# Patient Record
Sex: Female | Born: 1960 | Race: White | Hispanic: No | Marital: Married | Smoking: Never smoker
Health system: Southern US, Community
[De-identification: ages and names within clinical notes are randomized; demographics above are authoritative.]

## PROBLEM LIST (undated history)

## (undated) DIAGNOSIS — Z79899 Other long term (current) drug therapy: Secondary | ICD-10-CM

## (undated) DIAGNOSIS — I10 Essential (primary) hypertension: Secondary | ICD-10-CM

## (undated) DIAGNOSIS — M51369 Other intervertebral disc degeneration, lumbar region without mention of lumbar back pain or lower extremity pain: Secondary | ICD-10-CM

## (undated) DIAGNOSIS — M5136 Other intervertebral disc degeneration, lumbar region: Secondary | ICD-10-CM

## (undated) DIAGNOSIS — E7800 Pure hypercholesterolemia, unspecified: Secondary | ICD-10-CM

## (undated) DIAGNOSIS — F319 Bipolar disorder, unspecified: Secondary | ICD-10-CM

## (undated) DIAGNOSIS — K219 Gastro-esophageal reflux disease without esophagitis: Secondary | ICD-10-CM

---

## 1998-12-14 ENCOUNTER — Encounter: Payer: Self-pay | Admitting: Specialist

## 1998-12-14 ENCOUNTER — Ambulatory Visit (HOSPITAL_COMMUNITY): Admission: RE | Admit: 1998-12-14 | Discharge: 1998-12-14 | Payer: Self-pay | Admitting: Specialist

## 1999-04-24 ENCOUNTER — Other Ambulatory Visit: Admission: RE | Admit: 1999-04-24 | Discharge: 1999-04-24 | Payer: Self-pay | Admitting: Obstetrics and Gynecology

## 2000-12-05 ENCOUNTER — Encounter: Payer: Self-pay | Admitting: Obstetrics and Gynecology

## 2000-12-05 ENCOUNTER — Ambulatory Visit (HOSPITAL_COMMUNITY): Admission: RE | Admit: 2000-12-05 | Discharge: 2000-12-05 | Payer: Self-pay | Admitting: Obstetrics and Gynecology

## 2000-12-06 ENCOUNTER — Ambulatory Visit (HOSPITAL_COMMUNITY): Admission: AD | Admit: 2000-12-06 | Discharge: 2000-12-06 | Payer: Self-pay | Admitting: Obstetrics and Gynecology

## 2000-12-06 ENCOUNTER — Encounter (INDEPENDENT_AMBULATORY_CARE_PROVIDER_SITE_OTHER): Payer: Self-pay | Admitting: *Deleted

## 2001-06-29 ENCOUNTER — Other Ambulatory Visit: Admission: RE | Admit: 2001-06-29 | Discharge: 2001-06-29 | Payer: Self-pay | Admitting: Obstetrics and Gynecology

## 2002-06-30 ENCOUNTER — Other Ambulatory Visit: Admission: RE | Admit: 2002-06-30 | Discharge: 2002-06-30 | Payer: Self-pay | Admitting: Obstetrics and Gynecology

## 2003-08-02 ENCOUNTER — Other Ambulatory Visit: Admission: RE | Admit: 2003-08-02 | Discharge: 2003-08-02 | Payer: Self-pay | Admitting: Obstetrics and Gynecology

## 2004-08-10 ENCOUNTER — Other Ambulatory Visit: Admission: RE | Admit: 2004-08-10 | Discharge: 2004-08-10 | Payer: Self-pay | Admitting: Obstetrics and Gynecology

## 2005-08-28 ENCOUNTER — Other Ambulatory Visit: Admission: RE | Admit: 2005-08-28 | Discharge: 2005-08-28 | Payer: Self-pay | Admitting: Obstetrics and Gynecology

## 2008-05-30 ENCOUNTER — Ambulatory Visit: Payer: Self-pay | Admitting: Radiology

## 2008-05-30 ENCOUNTER — Ambulatory Visit (HOSPITAL_BASED_OUTPATIENT_CLINIC_OR_DEPARTMENT_OTHER): Admission: RE | Admit: 2008-05-30 | Discharge: 2008-05-30 | Payer: Self-pay | Admitting: Family Medicine

## 2009-05-02 ENCOUNTER — Encounter: Admission: RE | Admit: 2009-05-02 | Discharge: 2009-05-02 | Payer: Self-pay | Admitting: Obstetrics and Gynecology

## 2010-10-19 NOTE — Op Note (Signed)
[facility]  Patient:    Bridget Blevins, Bridget Blevins                         MRN: [id number] Proc. Date: 12/06/00 Adm. Date:  [id number] Attending:  Miguel Aschoff                           Operative Report  PREOPERATIVE DIAGNOSIS: 1. Fetal demise 11 weeks. 2. Cervical polyp.  POSTOPERATIVE DIAGNOSIS: 1. Fetal demise 11 weeks. 2. Cervical polyp.  OPERATION:  Removal of cervical polyp.  Dilatation and suction evacuation of uterus.  SURGEON:  Miguel Aschoff, M.D.  ANESTHESIA:  IV sedation with paracervical block.  COMPLICATIONS:  None.  ESTIMATED BLOOD LOSS:  INDICATIONS:  The patient is a 50 year old white female gravida 3, para 2-0-0-2 at approximately [redacted] weeks gestation.  The patient reported some light spotting and on examination with ultrasound she was found to have a fetal demise present in the uterus.  This was confirmed with two ultrasounds.  In view of fetal demise, she is being taken to the operating room now to evacuate the uterus.  In addition, she is noted to have a cervical polyp and this is going to be removed at this procedure.  Informed consent has been obtained.  DESCRIPTION OF PROCEDURE:  The patient was taken to the operating room and placed in a supine position and IV sedation was administered without difficulty.  She was then placed in a dorsal lithotomy position and prepped and draped in the usual sterile fashion.  Speculum was then placed in the vaginal vault.  The anterior cervical lip was grasped with a tenaculum and then the cervix was injected with 18 cc of 1% Xylocaine by placing 6 cc of Xylocaine at the 12, 4 and 8 oclock positions on the cervix.  Once this was done, using polyp forceps, the cervical polyp was removed without difficulty and this was sent as a separate specimen.  The cervical canal was then dilated using 0 Pratt dilators until a #25 Pratt dilator had been passed.  Then using a #8 vacuum curet, the contents of the  uterus were evacuated without difficulty and the products of conception were removed.  Following this, sharp curettage was carried out revealing a small amount of additional tissue and then a final pass was made with the suction curet until no further tissue was returned.  At this point, the procedure was completed.  All instruments were removed.  Hemostasis was readily achieved.  The patient was taken out of lithotomy position and brought to the recovery room in satisfactory condition.  The plan is for the patient to be discharged home.  Medications include doxycycline 100 mg 1 b.i.d. x 3 days, Darvocet-N 100 1 every 4-6 hours as needed for pain.  She is instructed to place nothing in the vagina and to call for call if there are any problems such as fever, pain or heavy bleeding.  She will be seen back in four weeks for a follow-up examination. DD:  12/06/00 TD:  12/06/00 Job: [id number]

## 2011-09-17 ENCOUNTER — Ambulatory Visit (INDEPENDENT_AMBULATORY_CARE_PROVIDER_SITE_OTHER): Payer: Federal, State, Local not specified - PPO | Admitting: Family Medicine

## 2011-09-17 ENCOUNTER — Encounter: Payer: Self-pay | Admitting: Family Medicine

## 2011-09-17 VITALS — BP 120/81 | HR 80 | Temp 98.3°F | Ht 64.0 in | Wt 110.0 lb

## 2011-09-17 DIAGNOSIS — M79609 Pain in unspecified limb: Secondary | ICD-10-CM

## 2011-09-17 DIAGNOSIS — M79672 Pain in left foot: Secondary | ICD-10-CM | POA: Insufficient documentation

## 2011-09-17 NOTE — Patient Instructions (Signed)
Your ultrasound is negative for a fracture or stress fracture. This is consistent with a stress reaction which essentially is irritation at the cortex of the bony that hasn't progressed to a stress fracture. No running for the next 2 weeks. Ok to bike, swim, and use elliptical.  Ok to walk if not painful. Ice 15 minutes at a time as needed. Tylenol and/or aleve as needed for pain. Consider sports insoles and metatarsal pads for cushion and support. In 2 weeks start walk:jog program - total 10 minutes of 1 minute jog to 1 minute walk.  Every other day increase total time by 5 minutes and jog time by 1-2 minutes.

## 2011-09-17 NOTE — Progress Notes (Signed)
  Subjective:    Patient ID: Bridget Blevins, female    DOB: 1960-09-24, 51 y.o.   MRN: [id number]  PCP: Dr. Bernadette Hoit  HPI 51 yo F here for left foot pain.  Patient reports she runs 6 miles a day. About 2 months ago started to feel like she had a bruise on dorsal part of left foot. Thought this was from hitting the top of her shoe - has been able to continue running without much difficulty. Then on 4/14 while in her usual run felt a sharp pain in same area mid- dorsal left foot and she stopped running due to pain. No swelling but appears bruised. Now feels much better. No prior stress fractures. Has regular periods. Takes a PPI.  History reviewed. No pertinent past medical history.  Current Outpatient Prescriptions on File Prior to Visit  Medication Sig Dispense Refill  . DEXILANT 60 MG capsule       . QUEtiapine (SEROQUEL) 100 MG tablet         History reviewed. No pertinent past surgical history.  No Known Allergies  History   Social History  . Marital Status: Single    Spouse Name: N/A    Number of Children: N/A  . Years of Education: N/A   Occupational History  . Not on file.   Social History Main Topics  . Smoking status: Never Smoker   . Smokeless tobacco: Not on file  . Alcohol Use: Not on file  . Drug Use: Not on file  . Sexually Active: Not on file   Other Topics Concern  . Not on file   Social History Narrative  . No narrative on file    Family History  Problem Relation Age of Onset  . Hypertension Mother   . Diabetes Neg Hx   . Heart attack Neg Hx   . Hyperlipidemia Neg Hx   . Sudden death Neg Hx     BP 120/81  Pulse 80  Temp(Src) 98.3 F (36.8 C) (Oral)  Ht 5\' 4"  (1.626 m)  Wt 110 lb (49.896 kg)  BMI 18.88 kg/m2 Review of Systems See HPI above.    Objective:   Physical Exam Gen: NAD  L foot: Questionable bruising dorsal foot.  No swelling, other deformity. Mild collapse transverse arch. Mild TTP 3rd MT head.  No other TTP  throughout foot or ankle. FROM ankle, digits without pain. Strength 5/5 all ankle motions without pain. NVI distally. Negative hop test.  MSK u/s: No evidence bony abnormalities, cortical edema, or neovascularity overlying 3rd MT or other MTs.    Assessment & Plan:  1. Left foot pain- 2/2 stress reaction vs metatarsalgia.  Able to do hop test without pain and MSK u/s reassuring this is not a stress fracture or frank fracture.  Start with sports insoles, metatarsal pads, relative rest.  Icing, tylenol as needed.  See instructions for further.  Start return to running in 2 weeks.

## 2011-09-17 NOTE — Assessment & Plan Note (Signed)
2/2 stress reaction vs metatarsalgia.  Able to do hop test without pain and MSK u/s reassuring this is not a stress fracture or frank fracture.  Start with sports insoles, metatarsal pads, relative rest.  Icing, tylenol as needed.  See instructions for further.  Start return to running in 2 weeks.

## 2011-10-24 ENCOUNTER — Ambulatory Visit (HOSPITAL_BASED_OUTPATIENT_CLINIC_OR_DEPARTMENT_OTHER)
Admission: RE | Admit: 2011-10-24 | Discharge: 2011-10-24 | Disposition: A | Discharge status: Home / Self Care | Payer: Federal, State, Local not specified - PPO | Source: Ambulatory Visit | Attending: Family Medicine | Admitting: Family Medicine

## 2011-10-24 ENCOUNTER — Ambulatory Visit (INDEPENDENT_AMBULATORY_CARE_PROVIDER_SITE_OTHER): Payer: Federal, State, Local not specified - PPO | Admitting: Family Medicine

## 2011-10-24 ENCOUNTER — Encounter: Payer: Self-pay | Admitting: Family Medicine

## 2011-10-24 VITALS — BP 122/79 | HR 89 | Ht 64.0 in | Wt 110.0 lb

## 2011-10-24 DIAGNOSIS — M79609 Pain in unspecified limb: Secondary | ICD-10-CM

## 2011-10-24 DIAGNOSIS — M79672 Pain in left foot: Secondary | ICD-10-CM

## 2011-10-24 DIAGNOSIS — M949 Disorder of cartilage, unspecified: Secondary | ICD-10-CM | POA: Insufficient documentation

## 2011-10-24 DIAGNOSIS — M899 Disorder of bone, unspecified: Secondary | ICD-10-CM | POA: Insufficient documentation

## 2011-10-24 NOTE — Patient Instructions (Signed)
Get the x-ray of your foot today. Assuming this is normal we will move ahead with MRI of your foot to further assess for a stress fracture. Take calcium 1300mg  daily and vitamin D 800 IU every day. I will contact you when I return to go over MRI results. Wear comfortable shoes. Ice as needed. Tylenol and/or aleve as needed for pain.

## 2011-10-25 ENCOUNTER — Encounter: Payer: Self-pay | Admitting: Family Medicine

## 2011-10-25 NOTE — Progress Notes (Addendum)
Subjective:    Patient ID: Bridget Blevins, female    DOB: 12-Dec-1960, 51 y.o.   MRN: [id number]  PCP: Dr. Bernadette Hoit  HPI  51 yo F here for f/u left foot pain.  4/16: Patient reports she runs 6 miles a day. About 2 months ago started to feel like she had a bruise on dorsal part of left foot. Thought this was from hitting the top of her shoe - has been able to continue running without much difficulty. Then on 4/14 while in her usual run felt a sharp pain in same area mid- dorsal left foot and she stopped running due to pain. No swelling but appears bruised. Now feels much better. No prior stress fractures. Has regular periods. Takes a PPI.  5/23: Patient reports she took about 2 weeks off running then went back to running for 25 minutes straight. Felt like she did ok until the end of the run. Has to wear a specific pair of flip flops with heel and arch support to be comfortable. Pain feels the same if not worse. Has been doing elliptical and swimming also - cannot push off wall with her left foot. Not icing or taking any pain medications currently.  History reviewed. No pertinent past medical history.  Current Outpatient Prescriptions on File Prior to Visit  Medication Sig Dispense Refill  . DEXILANT 60 MG capsule       . QUEtiapine (SEROQUEL) 100 MG tablet         History reviewed. No pertinent past surgical history.  No Known Allergies  History   Social History  . Marital Status: Married    Spouse Name: N/A    Number of Children: N/A  . Years of Education: N/A   Occupational History  . Not on file.   Social History Main Topics  . Smoking status: Never Smoker   . Smokeless tobacco: Not on file  . Alcohol Use: Not on file  . Drug Use: Not on file  . Sexually Active: Not on file   Other Topics Concern  . Not on file   Social History Narrative  . No narrative on file    Family History  Problem Relation Age of Onset  . Hypertension Mother   . Diabetes  Neg Hx   . Heart attack Neg Hx   . Hyperlipidemia Neg Hx   . Sudden death Neg Hx     BP 122/79  Pulse 89  Ht 5\' 4"  (1.626 m)  Wt 110 lb (49.896 kg)  BMI 18.88 kg/m2  LMP 10/03/2011 Review of Systems  See HPI above.    Objective:   Physical Exam  Gen: NAD  L foot: No bruising, swelling, other deformity. Mild collapse transverse arch. Mild TTP 3rd and 2nd MT heads.  No other TTP throughout foot or ankle. FROM ankle, digits without pain. Strength 5/5 all ankle motions without pain. NVI distally.  MSK u/s: Over MT heads she does have increased neovascularity primarily of 3rd MT, less so of 2nd.  No cortical irregularities or edema however.    Assessment & Plan:  1. Left foot pain - Despite relative rest patient continues to have pain mostly at 3rd MT, less so at 2nd.  She has still been active so may not have allowed ample time for presumed stress reaction to heal.  Now has increased neovascularity of 2nd and 3rd MT heads concerning for developing stress fracture but no bony irregularity or edema.  Will move forward with MRI to  further assess.  In meantime to wear comfortable shoes, avoid running and elliptical.  Ok for swimming but no kicking off wall with left foot.  Will call her with results and how to proceed.  Addendum:  MRI results were reviewed and discussed with patient - no evidence of stress fracture though she does have intermetatarsal head bursitis.  Advised this should improve with rest, inserts with metatarsal pads.  A couple other considerations would be custom orthotics and/or injections but I would prefer she try rest from painful activities and inserts first - she agrees with this treatment plan.  F/u in 4-6 weeks for reevaluation.

## 2011-10-25 NOTE — Assessment & Plan Note (Signed)
Despite relative rest patient continues to have pain mostly at 3rd MT, less so at 2nd.  She has still been active so may not have allowed ample time for presumed stress reaction to heal.  Now has increased neovascularity of 2nd and 3rd MT heads concerning for developing stress fracture but no bony irregularity or edema.  Will move forward with MRI to further assess.  In meantime to wear comfortable shoes, avoid running and elliptical.  Ok for swimming but no kicking off wall with left foot.  Will call her with results and how to proceed.

## 2011-10-26 ENCOUNTER — Ambulatory Visit (HOSPITAL_BASED_OUTPATIENT_CLINIC_OR_DEPARTMENT_OTHER)
Admission: RE | Admit: 2011-10-26 | Discharge: 2011-10-26 | Disposition: A | Discharge status: Home / Self Care | Payer: Federal, State, Local not specified - PPO | Source: Ambulatory Visit | Attending: Family Medicine | Admitting: Family Medicine

## 2011-10-26 DIAGNOSIS — M25579 Pain in unspecified ankle and joints of unspecified foot: Secondary | ICD-10-CM | POA: Insufficient documentation

## 2011-10-26 DIAGNOSIS — M7750 Other enthesopathy of unspecified foot: Secondary | ICD-10-CM | POA: Insufficient documentation

## 2011-10-26 DIAGNOSIS — M79672 Pain in left foot: Secondary | ICD-10-CM

## 2012-11-07 DIAGNOSIS — K222 Esophageal obstruction: Secondary | ICD-10-CM | POA: Insufficient documentation

## 2014-01-12 DIAGNOSIS — K219 Gastro-esophageal reflux disease without esophagitis: Secondary | ICD-10-CM | POA: Insufficient documentation

## 2016-01-19 DIAGNOSIS — I83812 Varicose veins of left lower extremities with pain: Secondary | ICD-10-CM | POA: Insufficient documentation

## 2017-01-30 DIAGNOSIS — I10 Essential (primary) hypertension: Secondary | ICD-10-CM | POA: Insufficient documentation

## 2017-04-15 DIAGNOSIS — M5136 Other intervertebral disc degeneration, lumbar region: Secondary | ICD-10-CM | POA: Insufficient documentation

## 2017-06-10 ENCOUNTER — Encounter: Payer: Self-pay | Admitting: Family Medicine

## 2017-06-10 ENCOUNTER — Ambulatory Visit (INDEPENDENT_AMBULATORY_CARE_PROVIDER_SITE_OTHER): Payer: Federal, State, Local not specified - PPO | Admitting: Family Medicine

## 2017-06-10 VITALS — BP 125/74 | HR 91 | Ht 64.0 in | Wt 110.0 lb

## 2017-06-10 DIAGNOSIS — M79642 Pain in left hand: Secondary | ICD-10-CM

## 2017-06-10 DIAGNOSIS — M79671 Pain in right foot: Secondary | ICD-10-CM | POA: Diagnosis not present

## 2017-06-10 NOTE — Patient Instructions (Signed)
We will go ahead with an MRI of your right foot given how long you've had symptoms here to assess for a 5th metatarsal stress fracture. I would make an appointment with me for your neck and back after you have the MRI - try to get a disc of images to bring with you either to that appointment or, ideally, drop off before that.  You have a flexor tendinitis of your left hand. Icing 15 minutes at a time 3-4 times a day. Aleve 2 tabs twice a day with food OR ibuprofen 600mg  three times a day with food may be helpful. Consider occupational therapy if you're struggling.

## 2017-06-12 ENCOUNTER — Other Ambulatory Visit (HOSPITAL_BASED_OUTPATIENT_CLINIC_OR_DEPARTMENT_OTHER): Payer: Self-pay | Admitting: Pediatrics

## 2017-06-14 ENCOUNTER — Ambulatory Visit (HOSPITAL_BASED_OUTPATIENT_CLINIC_OR_DEPARTMENT_OTHER)
Admission: RE | Admit: 2017-06-14 | Discharge: 2017-06-14 | Disposition: A | Discharge status: Home / Self Care | Payer: Federal, State, Local not specified - PPO | Source: Ambulatory Visit | Attending: Family Medicine | Admitting: Family Medicine

## 2017-06-14 DIAGNOSIS — M79671 Pain in right foot: Secondary | ICD-10-CM | POA: Diagnosis present

## 2017-06-14 DIAGNOSIS — M84374A Stress fracture, right foot, initial encounter for fracture: Secondary | ICD-10-CM | POA: Diagnosis not present

## 2017-06-14 DIAGNOSIS — M65871 Other synovitis and tenosynovitis, right ankle and foot: Secondary | ICD-10-CM | POA: Insufficient documentation

## 2017-06-17 ENCOUNTER — Encounter: Payer: Self-pay | Admitting: Family Medicine

## 2017-06-17 DIAGNOSIS — M79671 Pain in right foot: Secondary | ICD-10-CM | POA: Insufficient documentation

## 2017-06-17 DIAGNOSIS — M79642 Pain in left hand: Secondary | ICD-10-CM | POA: Insufficient documentation

## 2017-06-17 DIAGNOSIS — F319 Bipolar disorder, unspecified: Secondary | ICD-10-CM | POA: Insufficient documentation

## 2017-06-17 NOTE — Assessment & Plan Note (Signed)
over several months in avid runner.  Concerning for occult stress fracture though ultrasound is reassuring.  Will go ahead with an MRI.

## 2017-06-17 NOTE — Progress Notes (Signed)
PCP: Angelica Chessman, MD  Subjective:   HPI: Patient is a 57 y.o. female here for right foot, left hand pain.  Patient reports she had stress fracture about 2+ years ago of right foot (however, noted records and it was of her left foot). She thought she had pain lateral dorsal right foot over this time but given previously this was her left foot she's unsure except that it's been several months. She cannot run unless she's up on ball of her right foot. Hard to put full weight on this. Tried different shoes. Pain level 0/10 currently but can be sharp if walking or running on this normally. Also with palmar left hand pain for about 3 months. No pain currently here - really bothers with swimming. No swelling or bruising. No skin changes, numbness.  History reviewed. No pertinent past medical history.  Current Outpatient Medications on File Prior to Visit  Medication Sig Dispense Refill  . simvastatin (ZOCOR) 10 MG tablet Take by mouth.    Marland Kitchen amLODipine (NORVASC) 5 MG tablet     . DEXILANT 60 MG capsule     . diclofenac sodium (VOLTAREN) 1 % GEL     . hydrocortisone 2.5 % ointment hydrocortisone 2.5 % topical ointment    . lisinopril (PRINIVIL,ZESTRIL) 10 MG tablet     . Multiple Vitamins-Minerals (MULTIVITAMIN GUMMIES ADULT PO) Adult Multivitamin Gummies    . pantoprazole (PROTONIX) 40 MG tablet     . QUEtiapine (SEROQUEL) 100 MG tablet     . sucralfate (CARAFATE) 1 GM/10ML suspension Carafate 100 mg/mL oral suspension    . triamcinolone cream (KENALOG) 0.1 % triamcinolone acetonide 0.1 % topical cream     No current facility-administered medications on file prior to visit.     History reviewed. No pertinent surgical history.  No Known Allergies  Social History   Socioeconomic History  . Marital status: Married    Spouse name: Not on file  . Number of children: Not on file  . Years of education: Not on file  . Highest education level: Not on file  Social Needs  .  Financial resource strain: Not on file  . Food insecurity - worry: Not on file  . Food insecurity - inability: Not on file  . Transportation needs - medical: Not on file  . Transportation needs - non-medical: Not on file  Occupational History  . Not on file  Tobacco Use  . Smoking status: Never Smoker  . Smokeless tobacco: Never Used  Substance and Sexual Activity  . Alcohol use: Not on file  . Drug use: Not on file  . Sexual activity: Not on file  Other Topics Concern  . Not on file  Social History Narrative  . Not on file    Family History  Problem Relation Age of Onset  . Hypertension Mother   . Diabetes Neg Hx   . Heart attack Neg Hx   . Hyperlipidemia Neg Hx   . Sudden death Neg Hx     BP 125/74   Pulse 91   Ht 5\' 4"  (1.626 m)   Wt 110 lb (49.9 kg)   BMI 18.88 kg/m   Review of Systems: See HPI above.     Objective:  Physical Exam:  Gen: NAD, comfortable in exam room  Right foot/ankle: No gross deformity, swelling, ecchymoses FROM with 5/5 strength all directions. TTP 5th metatarsal mildly Negative ant drawer and talar tilt.   Negative syndesmotic compression. Negative metatarsal squeeze. Cannot do hop  test. Thompsons test negative. NV intact distally.  Left foot/ankle: FROM without pain.  Left hand: No deformity, swelling, bruising. FROM digits and wrist without pain. 5/5 strength of these. No tenderness currently - pain over flexor digitorum. NVI distally.   MSK u/s right foot:  No evidence cortical irregularity, neovascularity, edema overlying cortex of 5th metatarsal.  Assessment & Plan:  1. Right foot pain - over several months in avid runner.  Concerning for occult stress fracture though ultrasound is reassuring.  Will go ahead with an MRI.  2. Left hand pain - consistent with a flexor tendinitis of left hand.  Icing, aleve or ibuprofen.  Reassured.  Consider occupational therapy.

## 2017-06-17 NOTE — Assessment & Plan Note (Signed)
consistent with a flexor tendinitis of left hand.  Icing, aleve or ibuprofen.  Reassured.  Consider occupational therapy.

## 2017-08-29 ENCOUNTER — Emergency Department (HOSPITAL_BASED_OUTPATIENT_CLINIC_OR_DEPARTMENT_OTHER): Payer: Federal, State, Local not specified - PPO

## 2017-08-29 ENCOUNTER — Emergency Department (HOSPITAL_BASED_OUTPATIENT_CLINIC_OR_DEPARTMENT_OTHER)
Admission: EM | Admit: 2017-08-29 | Discharge: 2017-08-29 | Disposition: A | Discharge status: Home / Self Care | Payer: Federal, State, Local not specified - PPO | Attending: Emergency Medicine | Admitting: Emergency Medicine

## 2017-08-29 ENCOUNTER — Encounter (HOSPITAL_BASED_OUTPATIENT_CLINIC_OR_DEPARTMENT_OTHER): Payer: Self-pay | Admitting: Emergency Medicine

## 2017-08-29 ENCOUNTER — Other Ambulatory Visit: Payer: Self-pay

## 2017-08-29 DIAGNOSIS — R002 Palpitations: Secondary | ICD-10-CM

## 2017-08-29 DIAGNOSIS — I10 Essential (primary) hypertension: Secondary | ICD-10-CM | POA: Diagnosis not present

## 2017-08-29 DIAGNOSIS — Z79899 Other long term (current) drug therapy: Secondary | ICD-10-CM | POA: Insufficient documentation

## 2017-08-29 HISTORY — DX: Other intervertebral disc degeneration, lumbar region: M51.36

## 2017-08-29 HISTORY — DX: Other intervertebral disc degeneration, lumbar region without mention of lumbar back pain or lower extremity pain: M51.369

## 2017-08-29 HISTORY — DX: Essential (primary) hypertension: I10

## 2017-08-29 HISTORY — DX: Other long term (current) drug therapy: Z79.899

## 2017-08-29 HISTORY — DX: Bipolar disorder, unspecified: F31.9

## 2017-08-29 HISTORY — DX: Gastro-esophageal reflux disease without esophagitis: K21.9

## 2017-08-29 HISTORY — DX: Pure hypercholesterolemia, unspecified: E78.00

## 2017-08-29 LAB — BASIC METABOLIC PANEL
Anion gap: 9 (ref 5–15)
BUN: 14 mg/dL (ref 6–20)
CHLORIDE: 102 mmol/L (ref 101–111)
CO2: 26 mmol/L (ref 22–32)
CREATININE: 0.77 mg/dL (ref 0.44–1.00)
Calcium: 9.3 mg/dL (ref 8.9–10.3)
GFR calc non Af Amer: >60 mL/min (ref >60)
Glucose, Bld: 120 mg/dL — ABNORMAL HIGH (ref 65–99)
POTASSIUM: 3.4 mmol/L — AB (ref 3.5–5.1)
SODIUM: 137 mmol/L (ref 135–145)

## 2017-08-29 LAB — CBC WITH DIFFERENTIAL/PLATELET
BASOS PCT: 1 %
Basophils Absolute: 0 10*3/uL (ref 0.0–0.1)
EOS ABS: 0.4 10*3/uL (ref 0.0–0.7)
Eosinophils Relative: 7 %
HCT: 39.3 % (ref 36.0–46.0)
HEMOGLOBIN: 13.3 g/dL (ref 12.0–15.0)
Lymphocytes Relative: 44 %
Lymphs Abs: 2.2 10*3/uL (ref 0.7–4.0)
MCH: 30.6 pg (ref 26.0–34.0)
MCHC: 33.8 g/dL (ref 30.0–36.0)
MCV: 90.6 fL (ref 78.0–100.0)
Monocytes Absolute: 0.5 10*3/uL (ref 0.1–1.0)
Monocytes Relative: 10 %
NEUTROS PCT: 38 %
Neutro Abs: 1.9 10*3/uL (ref 1.7–7.7)
Platelets: 223 10*3/uL (ref 150–400)
RBC: 4.34 MIL/uL (ref 3.87–5.11)
RDW: 12.8 % (ref 11.5–15.5)
WBC: 5 10*3/uL (ref 4.0–10.5)

## 2017-08-29 LAB — TROPONIN I

## 2017-08-29 LAB — PREGNANCY, URINE: Preg Test, Ur: NEGATIVE

## 2017-08-29 LAB — D-DIMER, QUANTITATIVE (NOT AT ARMC): D DIMER QUANT: 0.44 ug{FEU}/mL (ref 0.00–0.50)

## 2017-08-29 NOTE — ED Provider Notes (Signed)
[facility] EMERGENCY DEPARTMENT Provider Note   CSN: [id number] Arrival date & time: 08/29/17  0029     History   Chief Complaint Chief Complaint  Patient presents with  . Palpitations    HPI Bridget Blevins is a 57 y.o. female.  The history is provided by the patient.  Palpitations   This is a new problem. The current episode started more than 1 week ago (4 months ago). The problem occurs constantly. The problem has not changed since onset.The problem is associated with caffeine (energy drinks). Pertinent negatives include no diaphoresis, no numbness, no chest pain, no chest pressure, no claudication, no exertional chest pressure, no near-syncope, no nausea, no vomiting, no headaches, no leg pain, no lower extremity edema, no dizziness, no weakness, no cough, no hemoptysis, no shortness of breath and no sputum production.    Past Medical History:  Diagnosis Date  . Bipolar 1 disorder (HCC)   . DDD (degenerative disc disease), lumbar   . Elevated cholesterol   . GERD (gastroesophageal reflux disease)   . Hypertension   . Long term current use of antipsychotic medication     Patient Active Problem List   Diagnosis Date Noted  . Bipolar disorder (HCC) 06/17/2017  . Right foot pain 06/17/2017  . Left hand pain 06/17/2017  . Degenerative disc disease, lumbar 04/15/2017  . Essential hypertension 01/30/2017  . Varicose veins of left lower extremity with pain 01/19/2016  . GERD (gastroesophageal reflux disease) 01/12/2014  . Stricture and stenosis of esophagus 11/07/2012  . Left foot pain 09/17/2011    History reviewed. No pertinent surgical history.   OB History   None      Home Medications    Prior to Admission medications   Medication Sig Start Date End Date Taking? Authorizing Provider  amLODipine (NORVASC) 5 MG tablet  05/07/17   [provider]  DEXILANT 60 MG capsule  09/14/11   [provider]  diclofenac sodium (VOLTAREN) 1 % GEL   04/21/17   [provider]  hydrocortisone 2.5 % ointment hydrocortisone 2.5 % topical ointment    [provider]  lisinopril (PRINIVIL,ZESTRIL) 10 MG tablet  05/09/17   [provider]  Multiple Vitamins-Minerals (MULTIVITAMIN GUMMIES ADULT PO) Adult Multivitamin Gummies    [provider]  pantoprazole (PROTONIX) 40 MG tablet  05/26/17   [provider]  QUEtiapine (SEROQUEL) 100 MG tablet  09/10/11   [provider]  simvastatin (ZOCOR) 10 MG tablet Take by mouth. 12/30/16 04/25/18  [provider]  sucralfate (CARAFATE) 1 GM/10ML suspension Carafate 100 mg/mL oral suspension    [provider]  triamcinolone cream (KENALOG) 0.1 % triamcinolone acetonide 0.1 % topical cream    [provider]    Family History Family History  Problem Relation Age of Onset  . Hypertension Mother   . Diabetes Neg Hx   . Heart attack Neg Hx   . Hyperlipidemia Neg Hx   . Sudden death Neg Hx     Social History Social History   Tobacco Use  . Smoking status: Never Smoker  . Smokeless tobacco: Never Used  Substance Use Topics  . Alcohol use: Not on file  . Drug use: Not on file     Allergies   Patient has no known allergies.   Review of Systems Review of Systems  Constitutional: Negative for diaphoresis.  Respiratory: Negative for cough, hemoptysis, sputum production and shortness of breath.   Cardiovascular: Positive for palpitations. Negative for  chest pain, claudication, leg swelling and near-syncope.  Gastrointestinal: Negative for nausea and vomiting.  Genitourinary: Negative for hematuria.  Neurological: Negative for dizziness, syncope, facial asymmetry, speech difficulty, weakness, numbness and headaches.  All other systems reviewed and are negative.    Physical Exam Updated Vital Signs BP 129/76   Pulse 82   Temp 97.7 F (36.5 C) (Oral)   Resp 16   Ht 5\' 4"  (1.626 m)   Wt 48.5 kg (107 lb)   SpO2  98%   BMI 18.37 kg/m   Physical Exam  Constitutional: She is oriented to person, place, and time. She appears well-developed and well-nourished. No distress.  HENT:  Head: Normocephalic and atraumatic.  Mouth/Throat: No oropharyngeal exudate.  Eyes: Pupils are equal, round, and reactive to light. Conjunctivae are normal.  Neck: Normal range of motion. Neck supple.  Cardiovascular: Normal rate, regular rhythm, normal heart sounds and intact distal pulses.  Pulmonary/Chest: Effort normal and breath sounds normal. No stridor. She has no wheezes. She has no rales.  Abdominal: Soft. Bowel sounds are normal. She exhibits no mass. There is no tenderness. There is no rebound and no guarding.  Musculoskeletal: Normal range of motion.  Neurological: She is alert and oriented to person, place, and time. She displays normal reflexes.  Skin: Skin is warm and dry. Capillary refill takes less than 2 seconds.  Psychiatric: Her mood appears anxious.  Nursing note and vitals reviewed.    ED Treatments / Results  Labs (all labs ordered are listed, but only abnormal results are displayed) Results for orders placed or performed during the hospital encounter of 08/29/17  CBC with Differential/Platelet  Result Value Ref Range   WBC 5.0 4.0 - 10.5 K/uL   RBC 4.34 3.87 - 5.11 MIL/uL   Hemoglobin 13.3 12.0 - 15.0 g/dL   HCT 09.639.3 04.536.0 - 40.946.0 %   MCV 90.6 78.0 - 100.0 fL   MCH 30.6 26.0 - 34.0 pg   MCHC 33.8 30.0 - 36.0 g/dL   RDW 81.112.8 91.411.5 - 78.215.5 %   Platelets 223 150 - 400 K/uL   Neutrophils Relative % 38 %   Neutro Abs 1.9 1.7 - 7.7 K/uL   Lymphocytes Relative 44 %   Lymphs Abs 2.2 0.7 - 4.0 K/uL   Monocytes Relative 10 %   Monocytes Absolute 0.5 0.1 - 1.0 K/uL   Eosinophils Relative 7 %   Eosinophils Absolute 0.4 0.0 - 0.7 K/uL   Basophils Relative 1 %   Basophils Absolute 0.0 0.0 - 0.1 K/uL  Basic metabolic panel  Result Value Ref Range   Sodium 137 135 - 145 mmol/L   Potassium 3.4 (L) 3.5  - 5.1 mmol/L   Chloride 102 101 - 111 mmol/L   CO2 26 22 - 32 mmol/L   Glucose, Bld 120 (H) 65 - 99 mg/dL   BUN 14 6 - 20 mg/dL   Creatinine, Ser 9.560.77 0.44 - 1.00 mg/dL   Calcium 9.3 8.9 - 21.310.3 mg/dL   GFR calc non Af Amer >60 >60 mL/min   GFR calc Af Amer >60 >60 mL/min   Anion gap 9 5 - 15  Troponin I  Result Value Ref Range   Troponin I <0.03 <0.03 ng/mL  D-dimer, quantitative (not [facility])  Result Value Ref Range   D-Dimer, Quant 0.44 0.00 - 0.50 ug/mL-FEU  Pregnancy, urine  Result Value Ref Range   Preg Test, Ur NEGATIVE NEGATIVE   Dg Chest 2 View  Result Date: 08/29/2017  CLINICAL DATA:  57 year old female with increasing episodes of palpitations. Shortness of breath and chest tightness tonight. EXAM: CHEST - 2 VIEW COMPARISON:  Chest radiographs 07/25/2017. FINDINGS: Normal cardiac size and mediastinal contours. Large lung volumes are stable. Increased AP dimension to the chest. Mild diffuse increased pulmonary interstitial markings are stable. No pneumothorax, pulmonary edema, pleural effusion or confluent pulmonary opacity. No acute osseous abnormality identified. Mild scoliosis. Negative visible bowel gas pattern. IMPRESSION: Pulmonary hyperinflation.  No acute cardiopulmonary abnormality. Electronically Signed   By: Odessa Fleming M.D.   On: 08/29/2017 01:38    EKG EKG Interpretation  Date/Time:  Friday August 29 2017 00:42:23 EDT Ventricular Rate:  81 PR Interval:    QRS Duration: 82 QT Interval:  388 QTC Calculation: 451 R Axis:   93 Text Interpretation:  Sinus rhythm Confirmed by Rosezella Kronick ([id number]) on 08/29/2017 2:10:44 AM   Radiology Dg Chest 2 View  Result Date: 08/29/2017 CLINICAL DATA:  57 year old female with increasing episodes of palpitations. Shortness of breath and chest tightness tonight. EXAM: CHEST - 2 VIEW COMPARISON:  Chest radiographs 07/25/2017. FINDINGS: Normal cardiac size and mediastinal contours. Large lung volumes are stable. Increased AP  dimension to the chest. Mild diffuse increased pulmonary interstitial markings are stable. No pneumothorax, pulmonary edema, pleural effusion or confluent pulmonary opacity. No acute osseous abnormality identified. Mild scoliosis. Negative visible bowel gas pattern. IMPRESSION: Pulmonary hyperinflation.  No acute cardiopulmonary abnormality. Electronically Signed   By: Odessa Fleming M.D.   On: 08/29/2017 01:38    Procedures Procedures (including critical care time)  Medications Ordered in ED Medications - No data to display   Low risk for PE with negative DDimer excludes PE in this low risk patient.  No ectopy on the monitor during visit.  Final Clinical Impressions(s) / ED Diagnoses   Final diagnoses:  Palpitations    Stop caffeine and all energy drinks.  Follow up with cardiology as previously scheduled.    Return for weakness, numbness, changes in vision or speech, fevers >100.4 unrelieved by medication, shortness of breath, intractable vomiting, or diarrhea, abdominal pain, Inability to tolerate liquids or food, cough, altered mental status or any concerns. No signs of systemic illness or infection. The patient is nontoxic-appearing on exam and vital signs are within normal limits.   I have reviewed the triage vital signs and the nursing notes. Pertinent labs &imaging results that were available during my care of the patient were reviewed by me and considered in my medical decision making (see chart for details).  After history, exam, and medical workup I feel the patient has been appropriately medically screened and is safe for discharge home. Pertinent diagnoses were discussed with the patient. Patient was given return precautions.     Tarika Mckethan, MD 08/29/17 [phone]

## 2017-08-29 NOTE — ED Notes (Signed)
Patient transported to X-ray 

## 2017-08-29 NOTE — ED Triage Notes (Signed)
Patient states that she has had "episodes" of palpitations since nov and they seem to be getting "worse" she states that she has had 2 tonight and felt like she was having a "heart attack" - she was SOB and chest tightness. Patient keeps reaching up and hold her throat

## 2019-06-10 ENCOUNTER — Ambulatory Visit: Payer: Federal, State, Local not specified - PPO | Admitting: Family Medicine

## 2019-06-10 ENCOUNTER — Ambulatory Visit: Payer: Self-pay

## 2019-06-10 ENCOUNTER — Encounter: Payer: Self-pay | Admitting: Family Medicine

## 2019-06-10 ENCOUNTER — Other Ambulatory Visit: Payer: Self-pay

## 2019-06-10 VITALS — BP 128/75 | HR 92 | Ht 64.0 in | Wt 105.0 lb

## 2019-06-10 DIAGNOSIS — M25531 Pain in right wrist: Secondary | ICD-10-CM | POA: Diagnosis not present

## 2019-06-10 NOTE — Assessment & Plan Note (Signed)
It appears that the third dorsal compartment has some change around the tendon as it intersects superiorly over the second dorsal compartment.  There is no obvious effusion but the tissue architecture is different than the surrounding area.  No signs of fracture or rupture -Counseled on home exercise therapy and supportive care. -Can try bracing if needed during swimming - can consider imaging or PT if needed

## 2019-06-10 NOTE — Progress Notes (Signed)
Bridget Blevins - 59 y.o. female MRN [id number]  Date of birth: 05-Nov-1960  SUBJECTIVE:  Including CC & ROS.  Chief Complaint  Patient presents with  . Wrist Injury    right wrist x 06/09/2019    Bridget Blevins is a 59 y.o. female that is presenting with right dorsal wrist pain.  The pain happened yesterday when she was reaching for the remote.  She fell forward and landed on her wrist.  Since that time she has had some swelling over the radial aspect of the dorsal carpal bones.  She is having some pain with extension of the thumb.  She denies any numbness or tingling.  She denies any lack of range of motion.  Symptoms have been constant.   Review of Systems See HPI   HISTORY: Past Medical, Surgical, Social, and Family History Reviewed & Updated per EMR.   Pertinent Historical Findings include:  Past Medical History:  Diagnosis Date  . Bipolar 1 disorder (HCC)   . DDD (degenerative disc disease), lumbar   . Elevated cholesterol   . GERD (gastroesophageal reflux disease)   . Hypertension   . Long term current use of antipsychotic medication     No past surgical history on file.  No Known Allergies  Family History  Problem Relation Age of Onset  . Hypertension Mother   . Diabetes Neg Hx   . Heart attack Neg Hx   . Hyperlipidemia Neg Hx   . Sudden death Neg Hx      Social History   Socioeconomic History  . Marital status: Married    Spouse name: Not on file  . Number of children: Not on file  . Years of education: Not on file  . Highest education level: Not on file  Occupational History  . Not on file  Tobacco Use  . Smoking status: Never Smoker  . Smokeless tobacco: Never Used  Substance and Sexual Activity  . Alcohol use: Not on file  . Drug use: Not on file  . Sexual activity: Not on file  Other Topics Concern  . Not on file  Social History Narrative  . Not on file   Social Determinants of Health   Financial Resource Strain:   . Difficulty of Paying Living  Expenses: Not on file  Food Insecurity:   . Worried About Programme researcher, broadcasting/film/video in the Last Year: Not on file  . Ran Out of Food in the Last Year: Not on file  Transportation Needs:   . Lack of Transportation (Medical): Not on file  . Lack of Transportation (Non-Medical): Not on file  Physical Activity:   . Days of Exercise per Week: Not on file  . Minutes of Exercise per Session: Not on file  Stress:   . Feeling of Stress : Not on file  Social Connections:   . Frequency of Communication with Friends and Family: Not on file  . Frequency of Social Gatherings with Friends and Family: Not on file  . Attends Religious Services: Not on file  . Active Member of Clubs or Organizations: Not on file  . Attends Banker Meetings: Not on file  . Marital Status: Not on file  Intimate Partner Violence:   . Fear of Current or Ex-Partner: Not on file  . Emotionally Abused: Not on file  . Physically Abused: Not on file  . Sexually Abused: Not on file     PHYSICAL EXAM:  VS: BP 128/75   Pulse 92  Ht 5\' 4"  (1.626 m)   Wt 105 lb (47.6 kg)   BMI 18.02 kg/m  Physical Exam Gen: NAD, alert, cooperative with exam, well-appearing ENT: normal lips, normal nasal mucosa,  Eye: normal EOM, normal conjunctiva and lids Skin: no rashes, no areas of induration  Neuro: normal tone, normal sensation to touch Psych:  normal insight, alert and oriented MSK:  Right wrist: Some fullness over the third dorsal compartment. Normal extension and flexion of the thumb. Normal abduction of the thumb. Normal pincer grasp. Normal grip strength. Normal wrist range of motion. No ecchymosis. Neurovascular intact  Limited ultrasound: Right wrist:  Normal-appearing first dorsal compartment. There appears to be some tissue change where the third compartment intersects over the second dorsal compartment.  No obvious effusion this area.  The third dorsal compartment is intact into its insertion. No  effusion noted in the carpal bones or distal radial joint  Summary: Findings suggest changes of the surrounding third dorsal compartment with no obvious rupture or tear  Ultrasound and interpretation by Clearance Coots, MD    ASSESSMENT & PLAN:   Right wrist pain It appears that the third dorsal compartment has some change around the tendon as it intersects superiorly over the second dorsal compartment.  There is no obvious effusion but the tissue architecture is different than the surrounding area.  No signs of fracture or rupture -Counseled on home exercise therapy and supportive care. -Can try bracing if needed during swimming - can consider imaging or PT if needed

## 2019-06-10 NOTE — Patient Instructions (Signed)
Nice to meet you Please try the ice You can try the brace with swimming   Please send me a message in MyChart with any questions or updates.  Please see [place] back if no better.   --Dr. Jordan Likes

## 2020-11-08 ENCOUNTER — Other Ambulatory Visit (HOSPITAL_BASED_OUTPATIENT_CLINIC_OR_DEPARTMENT_OTHER): Payer: Self-pay | Admitting: Obstetrics & Gynecology

## 2020-11-08 DIAGNOSIS — Z13820 Encounter for screening for osteoporosis: Secondary | ICD-10-CM

## 2020-11-20 ENCOUNTER — Ambulatory Visit (HOSPITAL_BASED_OUTPATIENT_CLINIC_OR_DEPARTMENT_OTHER)
Admission: RE | Admit: 2020-11-20 | Discharge: 2020-11-20 | Disposition: A | Discharge status: Home / Self Care | Payer: Federal, State, Local not specified - PPO | Source: Ambulatory Visit | Attending: Obstetrics & Gynecology | Admitting: Obstetrics & Gynecology

## 2020-11-20 ENCOUNTER — Other Ambulatory Visit: Payer: Self-pay

## 2020-11-20 DIAGNOSIS — Z13820 Encounter for screening for osteoporosis: Secondary | ICD-10-CM | POA: Diagnosis present

## 2020-11-30 ENCOUNTER — Encounter (HOSPITAL_BASED_OUTPATIENT_CLINIC_OR_DEPARTMENT_OTHER): Payer: Self-pay | Admitting: *Deleted

## 2020-11-30 ENCOUNTER — Other Ambulatory Visit: Payer: Self-pay

## 2020-11-30 ENCOUNTER — Emergency Department (HOSPITAL_BASED_OUTPATIENT_CLINIC_OR_DEPARTMENT_OTHER)
Admission: EM | Admit: 2020-11-30 | Discharge: 2020-11-30 | Disposition: A | Discharge status: Home / Self Care | Payer: Federal, State, Local not specified - PPO | Attending: Emergency Medicine | Admitting: Emergency Medicine

## 2020-11-30 DIAGNOSIS — I10 Essential (primary) hypertension: Secondary | ICD-10-CM | POA: Insufficient documentation

## 2020-11-30 DIAGNOSIS — E876 Hypokalemia: Secondary | ICD-10-CM

## 2020-11-30 DIAGNOSIS — Z79899 Other long term (current) drug therapy: Secondary | ICD-10-CM | POA: Insufficient documentation

## 2020-11-30 MED ORDER — POTASSIUM CHLORIDE CRYS ER 20 MEQ PO TBCR
40.0000 meq | EXTENDED_RELEASE_TABLET | Freq: Once | ORAL | Status: AC
  Administered 2020-11-30: 40 meq via ORAL
  Filled 2020-11-30: qty 2

## 2020-11-30 MED ORDER — POTASSIUM CHLORIDE CRYS ER 20 MEQ PO TBCR: 40.0000 meq | EXTENDED_RELEASE_TABLET | Freq: Two times a day (BID) | ORAL | 0 refills | Status: AC

## 2020-11-30 NOTE — ED Provider Notes (Addendum)
[facility] EMERGENCY DEPARTMENT Provider Note   CSN: [id number] Arrival date & time: 11/30/20  1659     History Chief Complaint  Patient presents with   k+2.7     Bridget Blevins is a 60 y.o. female.  Patient sent here because potassium was 2.7 with routine lab work.  Overall she feels fine.  No nausea, no vomiting.  The history is provided by the patient.  Illness Severity:  Mild Onset quality:  Gradual Associated symptoms: no congestion, no cough, no headaches and no vomiting       Past Medical History:  Diagnosis Date   Bipolar 1 disorder (HCC)    DDD (degenerative disc disease), lumbar    Elevated cholesterol    GERD (gastroesophageal reflux disease)    Hypertension    Long term current use of antipsychotic medication     Patient Active Problem List   Diagnosis Date Noted   Right wrist pain 06/10/2019   Bipolar disorder (HCC) 06/17/2017   Right foot pain 06/17/2017   Left hand pain 06/17/2017   Degenerative disc disease, lumbar 04/15/2017   Essential hypertension 01/30/2017   Varicose veins of left lower extremity with pain 01/19/2016   GERD (gastroesophageal reflux disease) 01/12/2014   Stricture and stenosis of esophagus 11/07/2012   Left foot pain 09/17/2011    History reviewed. No pertinent surgical history.   OB History   No obstetric history on file.     Family History  Problem Relation Age of Onset   Hypertension Mother    Diabetes Neg Hx    Heart attack Neg Hx    Hyperlipidemia Neg Hx    Sudden death Neg Hx     Social History   Tobacco Use   Smoking status: Never   Smokeless tobacco: Never  Vaping Use   Vaping Use: Never used  Substance Use Topics   Alcohol use: Not Currently    Home Medications Prior to Admission medications   Medication Sig Start Date End Date Taking? Authorizing Provider  potassium chloride SA (KLOR-CON) 20 MEQ tablet Take 2 tablets (40 mEq total) by mouth 2 (two) times daily for 5 days. 11/30/20  12/05/20 Yes Kahealani Yankovich, DO  amLODipine (NORVASC) 5 MG tablet  05/07/17   [provider]  DEXILANT 60 MG capsule  09/14/11   [provider]  diclofenac sodium (VOLTAREN) 1 % GEL  04/21/17   [provider]  hydrocortisone 2.5 % ointment hydrocortisone 2.5 % topical ointment    [provider]  lisinopril (PRINIVIL,ZESTRIL) 10 MG tablet  05/09/17   [provider]  Multiple Vitamins-Minerals (MULTIVITAMIN GUMMIES ADULT PO) Adult Multivitamin Gummies    [provider]  pantoprazole (PROTONIX) 40 MG tablet  05/26/17   [provider]  QUEtiapine (SEROQUEL) 100 MG tablet  09/10/11   [provider]  simvastatin (ZOCOR) 10 MG tablet Take by mouth. 12/30/16 04/25/18  [provider]  sucralfate (CARAFATE) 1 GM/10ML suspension Carafate 100 mg/mL oral suspension    [provider]  triamcinolone cream (KENALOG) 0.1 % triamcinolone acetonide 0.1 % topical cream    [provider]    Allergies    Patient has no known allergies.  Review of Systems   Review of Systems  HENT:  Negative for congestion.   Respiratory:  Negative for cough.   Gastrointestinal:  Negative for vomiting.  Skin:  Negative for wound.  Neurological:  Negative for headaches.   Physical Exam Updated Vital Signs BP (!) 145/90 (  BP Location: Left Arm)   Pulse 93   Temp 98.6 F (37 C) (Oral)   Resp 18   Ht 5' 4.5" (1.638 m)   Wt 48.1 kg   LMP 10/03/2011   SpO2 99%   BMI 17.91 kg/m   Physical Exam Constitutional:      General: She is not in acute distress.    Appearance: She is not ill-appearing.  Cardiovascular:     Pulses: Normal pulses.  Pulmonary:     Effort: Pulmonary effort is normal.  Skin:    General: Skin is warm.     Capillary Refill: Capillary refill takes less than 2 seconds.  Neurological:     General: No focal deficit present.     Mental Status: She is alert.    ED Results / Procedures / Treatments    Labs (all labs ordered are listed, but only abnormal results are displayed) Labs Reviewed - No data to display  EKG None  Radiology No results found.  Procedures Procedures   Medications Ordered in ED Medications  potassium chloride SA (KLOR-CON) CR tablet 40 mEq (40 mEq Oral Given 11/30/20 1721)    ED Course  I have reviewed the triage vital signs and the nursing notes.  Pertinent labs & imaging results that were available during my care of the patient were reviewed by me and considered in my medical decision making (see chart for details).    MDM Rules/Calculators/A&P                          Devory Mckinzie is here due to abnormal lab.  Had basic blood work done today that showed a potassium of 2.7.  She has no symptoms.  She has no nausea, vomiting, diarrhea.  Although she states may be 1 soft stool yesterday as she is on antibiotics for throat infection.  Suspect may be that this caused a mild drop in her potassium.  She already had a banana smoothie today. EKG shows sinus rhythm.  Normal intervals.  We will start her on oral potassium and have her follow-up with her primary care doctor for potassium recheck.  Told to return to the ED if she develops any muscle spasms, fatigue or other concerning symptoms.  Recommend that she eat potassium rich foods for a few days.  Discharged in good condition.  This chart was dictated using voice recognition software.  Despite best efforts to proofread,  errors can occur which can change the documentation meaning.   Final Clinical Impression(s) / ED Diagnoses Final diagnoses:  Hypokalemia    Rx / DC Orders ED Discharge Orders          Ordered    potassium chloride SA (KLOR-CON) 20 MEQ tablet  2 times daily        11/30/20 1729             Virgina Norfolk, DO 11/30/20 1730    Virgina Norfolk, DO 11/30/20 1732

## 2020-11-30 NOTE — ED Triage Notes (Signed)
Sent here from PMD office for K+2.7, denies any complaints

## 2021-10-30 ENCOUNTER — Other Ambulatory Visit: Payer: Self-pay | Admitting: Obstetrics & Gynecology

## 2021-10-30 DIAGNOSIS — N644 Mastodynia: Secondary | ICD-10-CM

## 2021-11-14 ENCOUNTER — Ambulatory Visit
Admission: RE | Admit: 2021-11-14 | Discharge: 2021-11-14 | Disposition: A | Discharge status: Home / Self Care | Payer: Federal, State, Local not specified - PPO | Source: Ambulatory Visit | Attending: Obstetrics & Gynecology | Admitting: Obstetrics & Gynecology

## 2021-11-14 ENCOUNTER — Ambulatory Visit: Payer: Federal, State, Local not specified - PPO

## 2021-11-14 DIAGNOSIS — N644 Mastodynia: Secondary | ICD-10-CM

## 2022-01-07 ENCOUNTER — Other Ambulatory Visit: Payer: Self-pay | Admitting: Obstetrics & Gynecology

## 2022-01-07 DIAGNOSIS — Z1231 Encounter for screening mammogram for malignant neoplasm of breast: Secondary | ICD-10-CM

## 2022-02-25 ENCOUNTER — Ambulatory Visit
Admission: RE | Admit: 2022-02-25 | Discharge: 2022-02-25 | Disposition: A | Discharge status: Home / Self Care | Payer: Federal, State, Local not specified - PPO | Source: Ambulatory Visit | Attending: Obstetrics & Gynecology | Admitting: Obstetrics & Gynecology

## 2022-02-25 DIAGNOSIS — Z1231 Encounter for screening mammogram for malignant neoplasm of breast: Secondary | ICD-10-CM

## 2022-09-16 ENCOUNTER — Encounter: Payer: Self-pay | Admitting: *Deleted

## 2023-01-29 ENCOUNTER — Other Ambulatory Visit: Payer: Self-pay | Admitting: Obstetrics & Gynecology

## 2023-01-29 DIAGNOSIS — Z1231 Encounter for screening mammogram for malignant neoplasm of breast: Secondary | ICD-10-CM

## 2023-02-27 ENCOUNTER — Ambulatory Visit: Payer: Federal, State, Local not specified - PPO

## 2023-03-12 ENCOUNTER — Ambulatory Visit
Admission: RE | Admit: 2023-03-12 | Discharge: 2023-03-12 | Disposition: A | Discharge status: Home / Self Care | Payer: Federal, State, Local not specified - PPO | Source: Ambulatory Visit | Attending: Obstetrics & Gynecology | Admitting: Obstetrics & Gynecology

## 2023-03-12 DIAGNOSIS — Z1231 Encounter for screening mammogram for malignant neoplasm of breast: Secondary | ICD-10-CM

## 2023-06-02 ENCOUNTER — Encounter (HOSPITAL_BASED_OUTPATIENT_CLINIC_OR_DEPARTMENT_OTHER): Payer: Self-pay | Admitting: Family Medicine

## 2023-06-02 ENCOUNTER — Other Ambulatory Visit (HOSPITAL_BASED_OUTPATIENT_CLINIC_OR_DEPARTMENT_OTHER): Payer: Self-pay | Admitting: Family Medicine

## 2023-06-02 DIAGNOSIS — M818 Other osteoporosis without current pathological fracture: Secondary | ICD-10-CM

## 2023-06-09 ENCOUNTER — Ambulatory Visit (HOSPITAL_BASED_OUTPATIENT_CLINIC_OR_DEPARTMENT_OTHER)
Admission: RE | Admit: 2023-06-09 | Discharge: 2023-06-09 | Disposition: A | Discharge status: Home / Self Care | Payer: Federal, State, Local not specified - PPO | Source: Ambulatory Visit | Attending: Family Medicine | Admitting: Family Medicine

## 2023-06-09 DIAGNOSIS — M818 Other osteoporosis without current pathological fracture: Secondary | ICD-10-CM | POA: Diagnosis present

## 2023-09-30 IMAGING — MG MM DIGITAL DIAGNOSTIC UNILAT*R* W/ TOMO W/ CAD
4 series · 4 of 12 positions shown · non-contrast
Comparison: Previous exam(s).

CLINICAL DATA: Nonfocal right breast pain.

EXAM:
DIGITAL DIAGNOSTIC UNILATERAL RIGHT MAMMOGRAM WITH TOMOSYNTHESIS AND
CAD
TECHNIQUE: Right digital diagnostic mammography and breast tomosynthesis was
performed. The images were evaluated with computer-aided detection.

[R CC synth-2D]
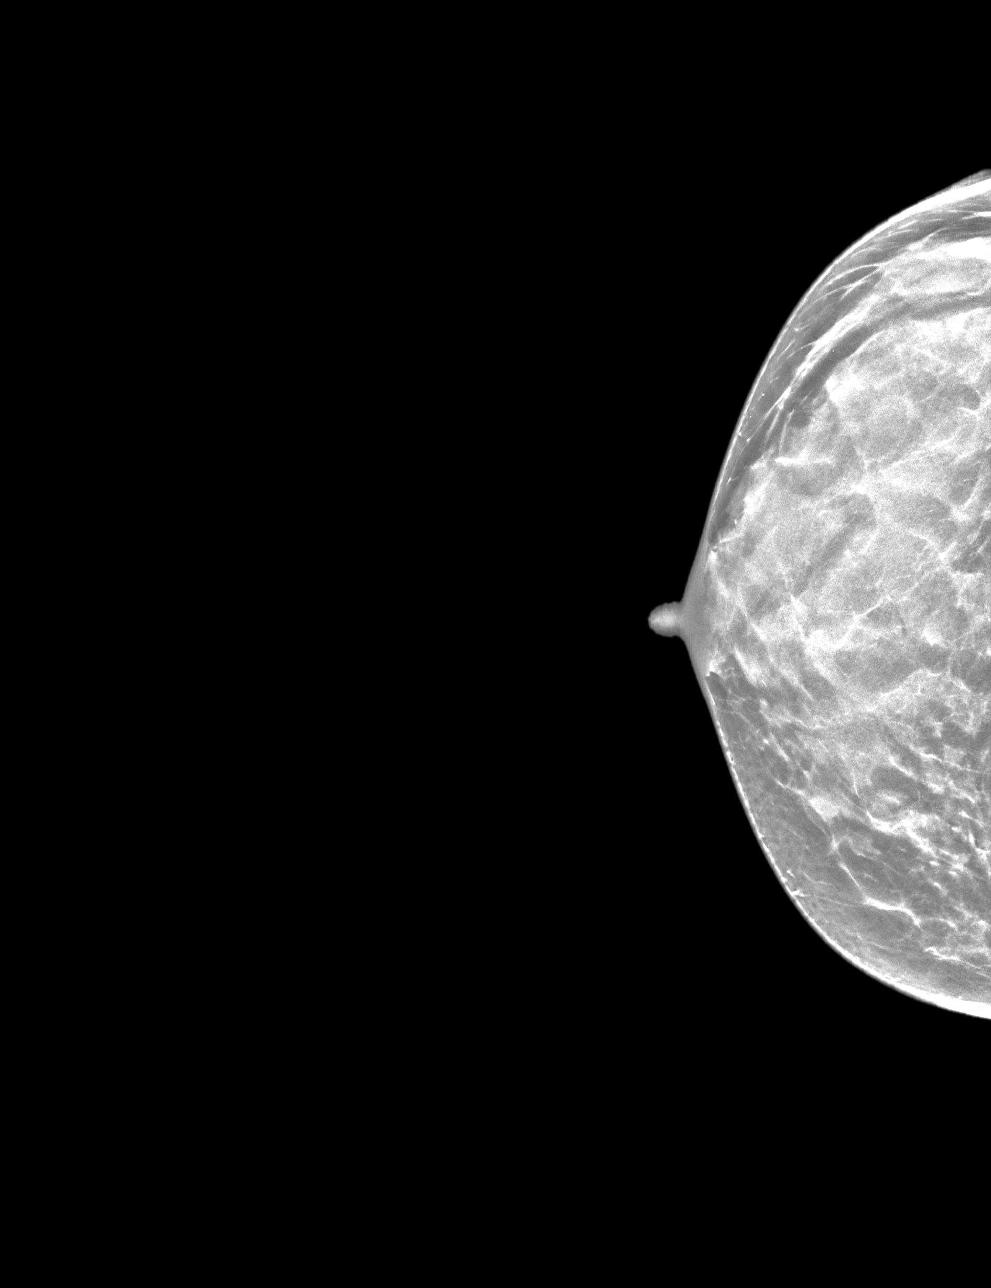

[R MLO synth-2D]
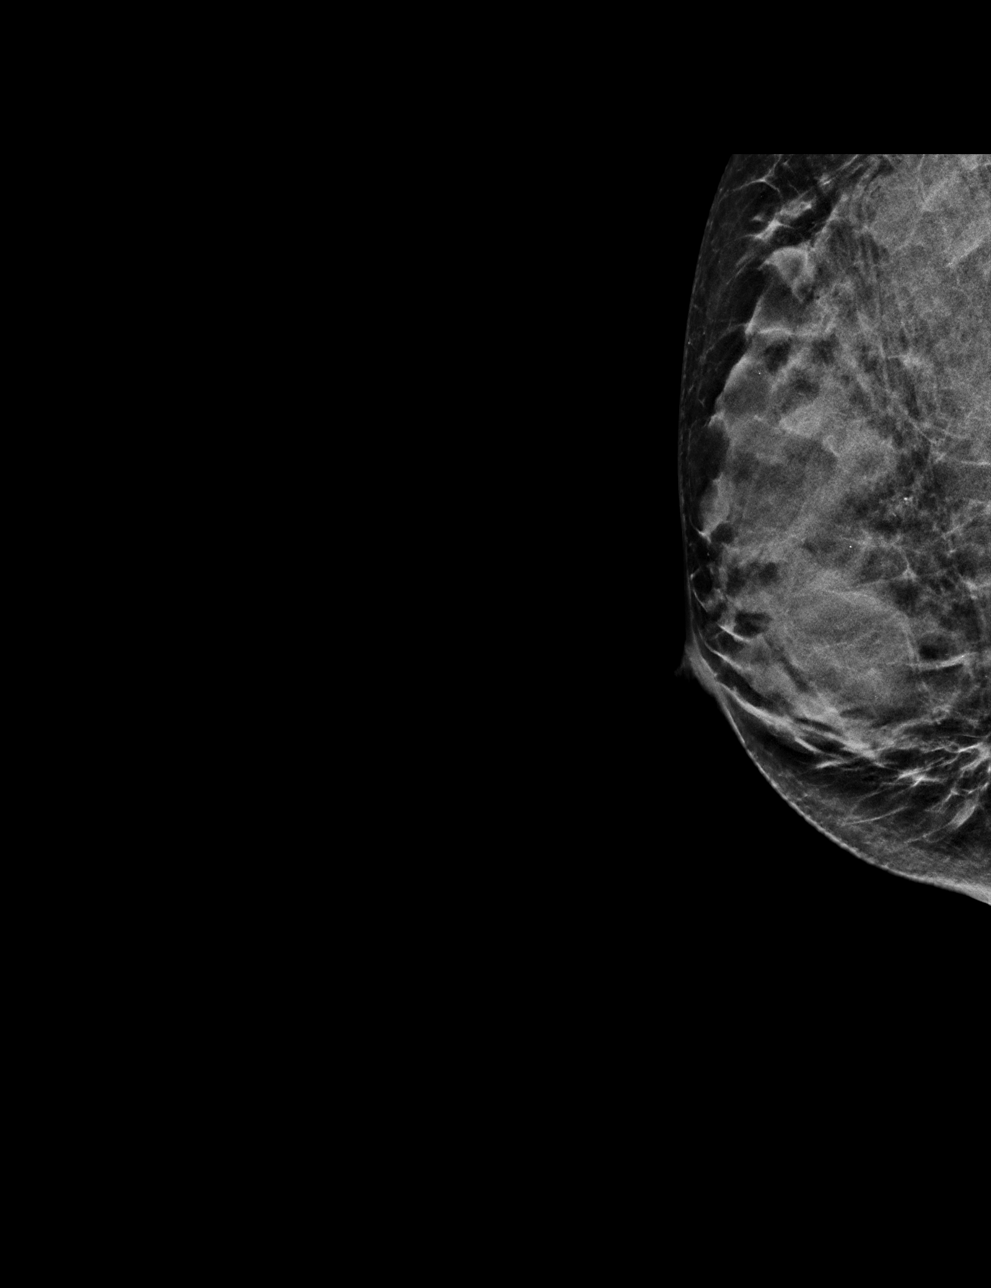

[R CC tomo · tomo slice 21/40.0]
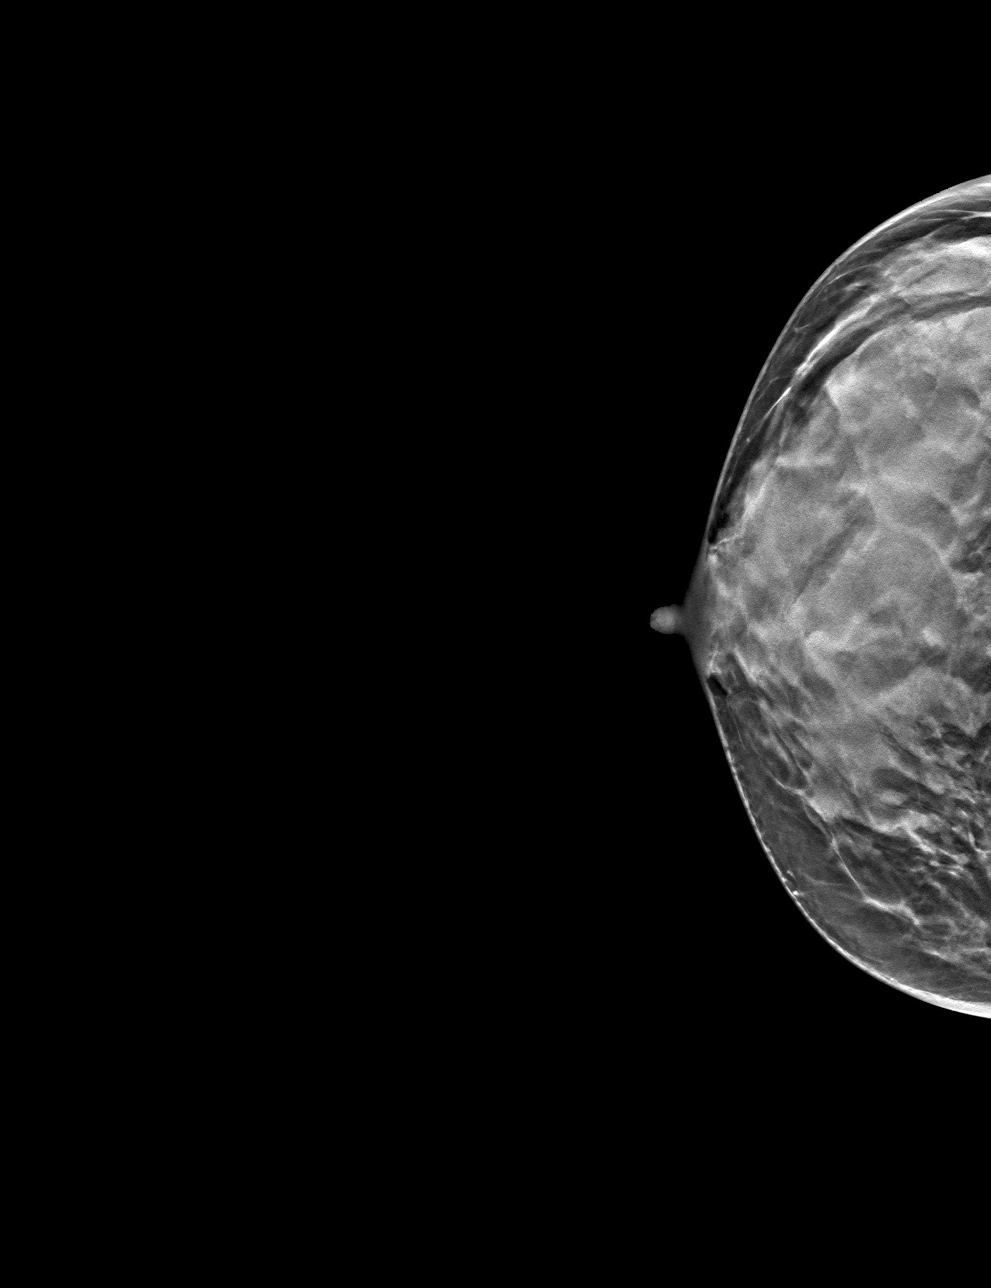

[R MLO tomo · tomo slice 21/41.0]
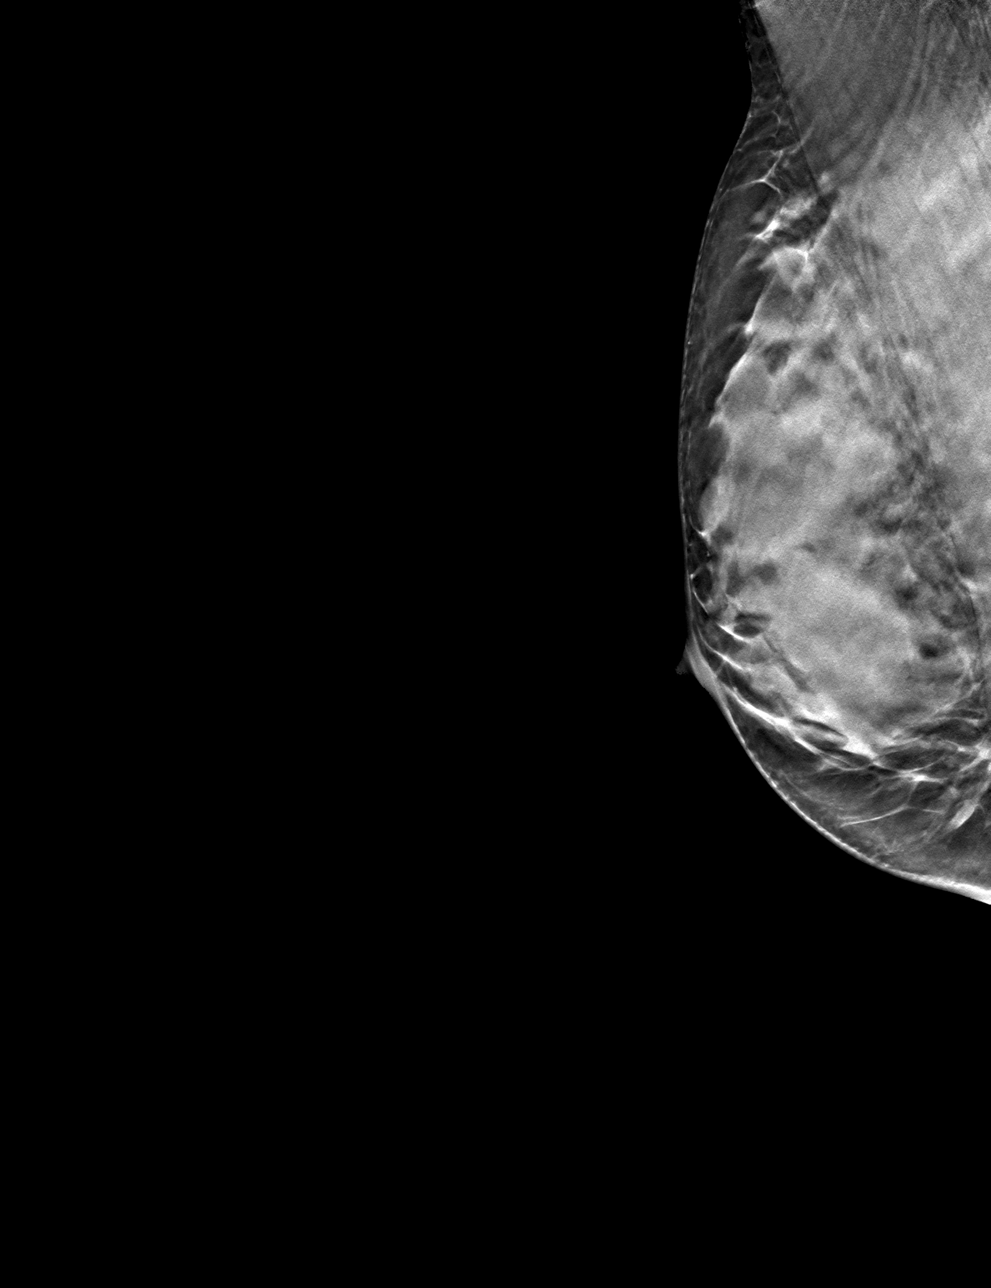

[4 of 12 positions shown; findings below may reference images not displayed]

ACR Breast Density Category c: The breast tissue is heterogeneously
dense, which may obscure small masses.
FINDINGS: No suspicious masses, calcifications, or distortion are identified
in the right breast.
IMPRESSION: No mammographic evidence of malignancy.

RECOMMENDATION:
Treatment of the patient's symptoms should be based on clinical and
physical exam given lack of imaging findings. Recommend annual
screening mammography next due in Tuesday February, 2022.

I have discussed the findings and recommendations with the patient.
If applicable, a reminder letter will be sent to the patient
regarding the next appointment.

BI-RADS CATEGORY  1: Negative.

## 2024-01-29 ENCOUNTER — Other Ambulatory Visit: Payer: Self-pay | Admitting: Obstetrics & Gynecology

## 2024-01-29 DIAGNOSIS — Z1231 Encounter for screening mammogram for malignant neoplasm of breast: Secondary | ICD-10-CM

## 2024-03-12 ENCOUNTER — Ambulatory Visit
Admission: RE | Admit: 2024-03-12 | Discharge: 2024-03-12 | Disposition: A | Discharge status: Home / Self Care | Source: Ambulatory Visit | Attending: Obstetrics & Gynecology | Admitting: Obstetrics & Gynecology

## 2024-03-12 DIAGNOSIS — Z1231 Encounter for screening mammogram for malignant neoplasm of breast: Secondary | ICD-10-CM
